# Patient Record
Sex: Female | Born: 1965 | Hispanic: No | Smoking: Never smoker
Health system: Southern US, Community
[De-identification: ages and names within clinical notes are randomized; demographics above are authoritative.]

## PROBLEM LIST (undated history)

## (undated) DIAGNOSIS — F419 Anxiety disorder, unspecified: Secondary | ICD-10-CM

## (undated) HISTORY — PX: UTERINE FIBROID SURGERY: SHX826

## (undated) HISTORY — PX: ABDOMINAL HYSTERECTOMY: SHX81

---

## 2014-07-12 ENCOUNTER — Other Ambulatory Visit: Payer: Self-pay | Admitting: Family Medicine

## 2014-07-12 DIAGNOSIS — Z1231 Encounter for screening mammogram for malignant neoplasm of breast: Secondary | ICD-10-CM

## 2014-07-20 ENCOUNTER — Ambulatory Visit
Admission: RE | Admit: 2014-07-20 | Discharge: 2014-07-20 | Disposition: A | Discharge status: Home / Self Care | Payer: BC Managed Care – PPO | Source: Ambulatory Visit | Attending: Family Medicine | Admitting: Family Medicine

## 2014-07-20 DIAGNOSIS — Z1231 Encounter for screening mammogram for malignant neoplasm of breast: Secondary | ICD-10-CM

## 2014-10-17 ENCOUNTER — Encounter (HOSPITAL_BASED_OUTPATIENT_CLINIC_OR_DEPARTMENT_OTHER): Payer: Self-pay | Admitting: *Deleted

## 2014-10-17 ENCOUNTER — Emergency Department (HOSPITAL_BASED_OUTPATIENT_CLINIC_OR_DEPARTMENT_OTHER): Payer: BC Managed Care – PPO

## 2014-10-17 ENCOUNTER — Emergency Department (HOSPITAL_BASED_OUTPATIENT_CLINIC_OR_DEPARTMENT_OTHER)
Admission: EM | Admit: 2014-10-17 | Discharge: 2014-10-17 | Disposition: A | Discharge status: Home / Self Care | Payer: BC Managed Care – PPO | Attending: Emergency Medicine | Admitting: Emergency Medicine

## 2014-10-17 DIAGNOSIS — Z3202 Encounter for pregnancy test, result negative: Secondary | ICD-10-CM | POA: Insufficient documentation

## 2014-10-17 DIAGNOSIS — R109 Unspecified abdominal pain: Secondary | ICD-10-CM

## 2014-10-17 DIAGNOSIS — N12 Tubulo-interstitial nephritis, not specified as acute or chronic: Secondary | ICD-10-CM | POA: Insufficient documentation

## 2014-10-17 LAB — COMPREHENSIVE METABOLIC PANEL
ALBUMIN: 4.4 g/dL (ref 3.5–5.2)
ALT: 38 U/L — ABNORMAL HIGH (ref 0–35)
AST: 37 U/L (ref 0–37)
Alkaline Phosphatase: 55 U/L (ref 39–117)
Anion gap: 9 (ref 5–15)
BUN: 17 mg/dL (ref 6–23)
CALCIUM: 9.2 mg/dL (ref 8.4–10.5)
CO2: 26 mmol/L (ref 19–32)
Chloride: 101 mmol/L (ref 96–112)
Creatinine, Ser: 0.84 mg/dL (ref 0.50–1.10)
GFR, EST NON AFRICAN AMERICAN: 80 mL/min — AB (ref >90)
Glucose, Bld: 84 mg/dL (ref 70–99)
POTASSIUM: 3.8 mmol/L (ref 3.5–5.1)
Sodium: 136 mmol/L (ref 135–145)
TOTAL PROTEIN: 7.7 g/dL (ref 6.0–8.3)
Total Bilirubin: 0.4 mg/dL (ref 0.3–1.2)

## 2014-10-17 LAB — CBC WITH DIFFERENTIAL/PLATELET
Basophils Absolute: 0 10*3/uL (ref 0.0–0.1)
Basophils Relative: 0 % (ref 0–1)
Eosinophils Absolute: 0.1 10*3/uL (ref 0.0–0.7)
Eosinophils Relative: 1 % (ref 0–5)
HEMATOCRIT: 38.9 % (ref 36.0–46.0)
Hemoglobin: 12.3 g/dL (ref 12.0–15.0)
Lymphocytes Relative: 31 % (ref 12–46)
Lymphs Abs: 3.1 10*3/uL (ref 0.7–4.0)
MCH: 26.7 pg (ref 26.0–34.0)
MCHC: 31.6 g/dL (ref 30.0–36.0)
MCV: 84.4 fL (ref 78.0–100.0)
MONOS PCT: 9 % (ref 3–12)
Monocytes Absolute: 0.9 10*3/uL (ref 0.1–1.0)
NEUTROS PCT: 59 % (ref 43–77)
Neutro Abs: 5.9 10*3/uL (ref 1.7–7.7)
Platelets: 259 10*3/uL (ref 150–400)
RBC: 4.61 MIL/uL (ref 3.87–5.11)
RDW: 13.4 % (ref 11.5–15.5)
WBC: 10 10*3/uL (ref 4.0–10.5)

## 2014-10-17 LAB — URINE MICROSCOPIC-ADD ON

## 2014-10-17 LAB — URINALYSIS, ROUTINE W REFLEX MICROSCOPIC
Bilirubin Urine: NEGATIVE
Glucose, UA: NEGATIVE mg/dL
KETONES UR: NEGATIVE mg/dL
NITRITE: NEGATIVE
PH: 5.5 (ref 5.0–8.0)
Protein, ur: 100 mg/dL — AB
Specific Gravity, Urine: 1.012 (ref 1.005–1.030)
UROBILINOGEN UA: 0.2 mg/dL (ref 0.0–1.0)

## 2014-10-17 LAB — PREGNANCY, URINE: Preg Test, Ur: NEGATIVE

## 2014-10-17 MED ORDER — CEFTRIAXONE SODIUM 1 G IJ SOLR
1.0000 g | Freq: Once | INTRAMUSCULAR | Status: AC
  Administered 2014-10-17: 1 g via INTRAVENOUS

## 2014-10-17 MED ORDER — KETOROLAC TROMETHAMINE 30 MG/ML IJ SOLN
30.0000 mg | Freq: Once | INTRAMUSCULAR | Status: AC
  Administered 2014-10-17: 30 mg via INTRAVENOUS
  Filled 2014-10-17: qty 1

## 2014-10-17 MED ORDER — MORPHINE SULFATE 4 MG/ML IJ SOLN
4.0000 mg | Freq: Once | INTRAMUSCULAR | Status: AC
  Administered 2014-10-17: 4 mg via INTRAVENOUS
  Filled 2014-10-17: qty 1

## 2014-10-17 MED ORDER — CEFTRIAXONE SODIUM 1 G IJ SOLR
INTRAMUSCULAR | Status: AC
  Filled 2014-10-17: qty 10

## 2014-10-17 MED ORDER — CIPROFLOXACIN HCL 500 MG PO TABS: 500.0000 mg | ORAL_TABLET | Freq: Two times a day (BID) | ORAL | Status: AC

## 2014-10-17 MED ORDER — HYDROCODONE-ACETAMINOPHEN 5-325 MG PO TABS: 1.0000 | ORAL_TABLET | Freq: Four times a day (QID) | ORAL | Status: AC | PRN

## 2014-10-17 MED ORDER — SODIUM CHLORIDE 0.9 % IV BOLUS (SEPSIS)
1000.0000 mL | Freq: Once | INTRAVENOUS | Status: AC
  Administered 2014-10-17: 1000 mL via INTRAVENOUS

## 2014-10-17 NOTE — ED Notes (Signed)
Right flank pain x 1 day- similar episodes twice previously

## 2014-10-17 NOTE — Discharge Instructions (Signed)
Take motrin for pain.   Take cipro twice daily for 10 days.   Take vicodin for severe pain. DO NOT drive with it.   Follow up with your doctor.   Return to ER if you have severe pain, fever, vomiting.

## 2014-10-17 NOTE — ED Provider Notes (Signed)
CSN: [id number]     Arrival date & time 10/17/14  1449 History  This chart was scribed for Richardean Canal, MD by Tonye Royalty, ED Scribe. This patient was seen in room MH04/MH04 and the patient's care was started at 3:22 PM.    Chief Complaint  Patient presents with  . Flank Pain   The history is provided by the patient. No language interpreter was used.    HPI Comments: Rebecca Peterson is a 49 y.o. female who presents to the Emergency Department complaining of right flank pain with onset yesterday. She reports having similar symptoms twice previously. She states it resolved temporarily yesterday with Ibuprofen; she has not used any other medication for it. She describes it as cramping. She states it moved around the side but then returned to her right flank and is staying there. She denies heavy lifting or other exertion. She reports history of scoliosis, but states flare ups have not affected her flank area before. She denies history of kidney stones or other health problems. She has had children and has difficulty comparing pain it to labor pain because it was a long time ago. She denies vomiting, fever, dysuria, hematuria, constipation, or diarrhea.  History reviewed. No pertinent past medical history. Past Surgical History  Procedure Laterality Date  . Cesarean section    . Uterine fibroid surgery     No family history on file. History  Substance Use Topics  . Smoking status: Never Smoker   . Smokeless tobacco: Never Used  . Alcohol Use: Yes     Comment: social drinker   OB History    No data available     Review of Systems  Constitutional: Negative for fever.  Gastrointestinal: Negative for vomiting, diarrhea and constipation.  Genitourinary: Positive for flank pain. Negative for dysuria and hematuria.  All other systems reviewed and are negative.     Allergies  Review of patient's allergies indicates no known allergies.  Home Medications   Prior to Admission medications    Not on File   BP 112/69 mmHg  Pulse 65  Temp(Src) 97.9 F (36.6 C) (Oral)  Resp 18  Ht  (1.727 m)  Wt 163 lb (73.936 kg)  BMI 24.79 kg/m2  SpO2 100% Physical Exam  Constitutional: She is oriented to person, place, and time. She appears well-developed and well-nourished.  HENT:  Head: Normocephalic and atraumatic.  Eyes: Conjunctivae are normal.  Neck: Normal range of motion. Neck supple.  Cardiovascular: Normal rate, regular rhythm and normal heart sounds.   No murmur heard. Pulmonary/Chest: Effort normal and breath sounds normal. No respiratory distress. She has no wheezes. She has no rales.  Abdominal: Soft. She exhibits no distension. There is no tenderness.  Right CVA tenderness  Musculoskeletal: Normal range of motion.  Neurological: She is alert and oriented to person, place, and time.  Skin: Skin is warm and dry.  Psychiatric: She has a normal mood and affect.  Nursing note and vitals reviewed.   ED Course  Procedures (including critical care time)  DIAGNOSTIC STUDIES: Oxygen Saturation is 100% on room air, normal by my interpretation.    COORDINATION OF CARE: 3:30 PM Discussed treatment plan with patient at beside, including CT scan and lab work. The patient agrees with the plan and has no further questions at this time.   Labs Review Labs Reviewed  URINALYSIS, ROUTINE W REFLEX MICROSCOPIC - Abnormal; Notable for the following:    APPearance CLOUDY (*)    Hgb urine  dipstick MODERATE (*)    Protein, ur 100 (*)    Leukocytes, UA LARGE (*)    All other components within normal limits  COMPREHENSIVE METABOLIC PANEL - Abnormal; Notable for the following:    ALT 38 (*)    GFR calc non Af Amer 80 (*)    All other components within normal limits  URINE MICROSCOPIC-ADD ON - Abnormal; Notable for the following:    Bacteria, UA MANY (*)    All other components within normal limits  PREGNANCY, URINE  CBC WITH DIFFERENTIAL/PLATELET    Imaging Review Ct  Renal Stone Study  10/17/2014   CLINICAL DATA:  RIGHT flank pain. Onset of symptoms yesterday. Urinary tract infection.  EXAM: CT ABDOMEN AND PELVIS WITHOUT CONTRAST  TECHNIQUE: Multidetector CT imaging of the abdomen and pelvis was performed following the standard protocol without IV contrast.  COMPARISON:  None.  FINDINGS: Musculoskeletal:  No aggressive osseous lesions.  Lung Bases: Mild motion artifact. Dependent atelectasis. Old granulomatous disease with RIGHT lower lobe calcified granuloma.  Liver: Unenhanced CT was performed per clinician order. Lack of IV contrast limits sensitivity and specificity, especially for evaluation of abdominal/pelvic solid viscera. Grossly normal.  Spleen:  Normal.  Gallbladder:  Contracted.  No calcified stones.  Common bile duct:  Normal.  Axial normal.  Pancreas:  Normal.  Adrenal glands:  Normal.  Kidneys: LEFT kidney and ureter appear normal. There is mild RIGHT hydronephrosis with stranding of the RIGHT renal pelvis and periureteric stranding extending to the anatomic pelvis. No renal or ureteral calculi. The appearance is most consistent with ascending urinary tract infection.  Stomach:  Grossly normal.  Small bowel:  Normal.  Colon:   Normal appendix.  Large stool burden in the RIGHT colon.  Pelvic Genitourinary: Urinary bladder shows stranding at the anterior bladder dome, again consistent with urinary tract infection. Moderate distention of the urinary bladder. Probable partial or complete hysterectomy.  Peritoneum: Physiologic free fluid.  No free air.  Vasculature: Grossly normal.  Body Wall: Low anterior abdominal wall scarring. Fat containing periumbilical hernia.  IMPRESSION: Constellation of findings compatible with ascending urinary tract infection on the RIGHT side and cystitis.   Electronically Signed   By: Andreas NewportGeoffrey  Lamke M.D.   On: 10/17/2014 17:38     EKG Interpretation None      MDM   Final diagnoses:  Right flank pain   Rebecca CordsMirna Reuel Peterson is a 49  y.o. female here with R flank pain. Consider MSK vs stone. Will get labs, UA, CT renal stone.   6:32 PM CT showed R cystitis and likely pyelo. UA + UTI. Given ceftriaxone. WBC nl. Doesn't appear septic. Will dc home on abx. Urine culture sent.    I personally performed the services described in this documentation, which was scribed in my presence. The recorded information has been reviewed and is accurate.   Richardean Canalavid H Carisha Kantor, MD 10/17/14 [phone]

## 2014-10-19 LAB — URINE CULTURE: Colony Count: >=100000

## 2014-10-20 ENCOUNTER — Telehealth (HOSPITAL_BASED_OUTPATIENT_CLINIC_OR_DEPARTMENT_OTHER): Payer: Self-pay | Admitting: Emergency Medicine

## 2014-10-20 NOTE — Telephone Encounter (Signed)
Post ED Visit - Positive Culture Follow-up  Culture report reviewed by antimicrobial stewardship pharmacist: []  Wes Dulaney, Pharm.D., BCPS [x]  Celedonio MiyamotoJeremy Frens, [address] BCPS []  Georgina PillionElizabeth Martin, Pharm.D., BCPS []  [place]ason CityMinh Pham, [address] BCPS, AAHIVP []  Estella HuskMichelle Turner, Pharm.D., BCPS, AAHIVP []  Elder CyphersLorie Poole, [address] BCPS  Positive urine culture E. coli Treated with ciprofloxacin, organism sensitive to the same and no further patient follow-up is required at this time.  Berle MullMiller, Rigdon Macomber 10/20/2014, 2:15 PM

## 2015-10-21 ENCOUNTER — Emergency Department (HOSPITAL_COMMUNITY)
Admission: EM | Admit: 2015-10-21 | Discharge: 2015-10-21 | Disposition: A | Discharge status: Home / Self Care | Payer: BC Managed Care – PPO | Attending: Emergency Medicine | Admitting: Emergency Medicine

## 2015-10-21 ENCOUNTER — Encounter (HOSPITAL_COMMUNITY): Payer: Self-pay | Admitting: Emergency Medicine

## 2015-10-21 DIAGNOSIS — Y9389 Activity, other specified: Secondary | ICD-10-CM | POA: Insufficient documentation

## 2015-10-21 DIAGNOSIS — S0181XA Laceration without foreign body of other part of head, initial encounter: Secondary | ICD-10-CM | POA: Insufficient documentation

## 2015-10-21 DIAGNOSIS — Z8659 Personal history of other mental and behavioral disorders: Secondary | ICD-10-CM | POA: Diagnosis not present

## 2015-10-21 DIAGNOSIS — F419 Anxiety disorder, unspecified: Secondary | ICD-10-CM | POA: Diagnosis not present

## 2015-10-21 DIAGNOSIS — Z23 Encounter for immunization: Secondary | ICD-10-CM | POA: Insufficient documentation

## 2015-10-21 DIAGNOSIS — Y998 Other external cause status: Secondary | ICD-10-CM | POA: Insufficient documentation

## 2015-10-21 DIAGNOSIS — Y92511 Restaurant or cafe as the place of occurrence of the external cause: Secondary | ICD-10-CM | POA: Diagnosis not present

## 2015-10-21 DIAGNOSIS — W01198A Fall on same level from slipping, tripping and stumbling with subsequent striking against other object, initial encounter: Secondary | ICD-10-CM | POA: Insufficient documentation

## 2015-10-21 HISTORY — DX: Anxiety disorder, unspecified: F41.9

## 2015-10-21 MED ORDER — TETANUS-DIPHTH-ACELL PERTUSSIS 5-2.5-18.5 LF-MCG/0.5 IM SUSP
0.5000 mL | Freq: Once | INTRAMUSCULAR | Status: AC
  Administered 2015-10-21: 0.5 mL via INTRAMUSCULAR
  Filled 2015-10-21: qty 0.5

## 2015-10-21 MED ORDER — LIDOCAINE-EPINEPHRINE-TETRACAINE (LET) SOLUTION
3.0000 mL | Freq: Once | NASAL | Status: AC
  Administered 2015-10-21: 3 mL via TOPICAL
  Filled 2015-10-21: qty 3

## 2015-10-21 MED ORDER — LORAZEPAM 1 MG PO TABS
1.0000 mg | ORAL_TABLET | Freq: Once | ORAL | Status: AC
  Administered 2015-10-21: 1 mg via ORAL
  Filled 2015-10-21: qty 1

## 2015-10-21 MED ORDER — LIDOCAINE-EPINEPHRINE (PF) 2 %-1:200000 IJ SOLN
10.0000 mL | Freq: Once | INTRAMUSCULAR | Status: AC
  Administered 2015-10-21: 10 mL
  Filled 2015-10-21: qty 10

## 2015-10-21 NOTE — ED Notes (Signed)
Pt states she slipped on wet floor at restaurant @ 1900, struck chin on sink, laceration noted, minimal bleeding

## 2015-10-21 NOTE — Discharge Instructions (Signed)

## 2015-10-21 NOTE — ED Provider Notes (Signed)
CSN: [id number]     Arrival date & time 10/21/15  2054 History  By signing my name below, I, Rebecca Peterson, attest that this documentation has been prepared under the direction and in the presence of TXU CorpHannah Matia Zelada, PA-C.   Electronically Signed: Iona Beardhristian Peterson, ED Scribe 10/21/2015 at 10:51 PM.  Chief Complaint  Patient presents with  . Laceration    The history is provided by the patient and medical records. No language interpreter was used.   HPI Comments: Leafy RoMirna E Peterson is a 50 y.o. female who presents to the Emergency Department complaining of sudden onset, laceration to central chin, onset around 7:30 PM when she fell and struck her chin on a sink. She denies LOC or significant head trauma in the incident. She reports minimal pain to the chin and initially dental pain to her lower teeth (now resolved). No other associated symptoms noted. No other worsening or alleviating factors noted. Pt denies neck pain, back pain, headache, numbness, weakness, syncope, loss of bowel or bladder control, seizure or any other pertinent symptoms. Pt is unsure of her tetanus status.  Pt reports she is very nervous and appears very anxious.    Past Medical History  Diagnosis Date  . Anxiety    Past Surgical History  Procedure Laterality Date  . Cesarean section     No family history on file. Social History  Substance Use Topics  . Smoking status: Never Smoker   . Smokeless tobacco: None  . Alcohol Use: Yes   OB History    No data available     Review of Systems  Constitutional: Negative for fever.  HENT: Negative for facial swelling and nosebleeds.   Musculoskeletal: Positive for arthralgias. Negative for back pain and neck pain.       Pain noted to chin.   Skin: Positive for wound.  Neurological: Negative for seizures, syncope, weakness, numbness and headaches.  Psychiatric/Behavioral: The patient is nervous/anxious.   All other systems reviewed and are negative.   Allergies   Review of patient's allergies indicates no known allergies.  Home Medications   Prior to Admission medications   Not on File   BP 136/80 mmHg  Pulse 98  Temp(Src) 98.1 F (36.7 C) (Oral)  Resp 18  Ht 5\' 8"  (1.727 m)  Wt 184 lb (83.462 kg)  BMI 27.98 kg/m2  SpO2 98% Physical Exam  Constitutional: She is oriented to person, place, and time. She appears well-developed and well-nourished. No distress.  HENT:  Head: Normocephalic and atraumatic.  No broken or cracked teeth; no loose teeth; no intraoral lacerations; no trismus.   Eyes: Conjunctivae are normal. No scleral icterus.  Neck: Normal range of motion.  Full ROM without pain; no midline or paraspinal TTP.   Cardiovascular: Normal rate, regular rhythm, normal heart sounds and intact distal pulses.   No murmur heard. Capillary refill < 3 sec  Pulmonary/Chest: Effort normal and breath sounds normal. No respiratory distress.  Musculoskeletal: Normal range of motion. She exhibits no edema.  Neurological: She is alert and oriented to person, place, and time.  Skin: Skin is warm and dry. She is not diaphoretic.  3 cm laceration to underside of chin, not a through and through.   Psychiatric: Her mood appears anxious.  Pt very anxious about laceration repair  Nursing note and vitals reviewed.   ED Course  .Marland Kitchen.Laceration Repair Date/Time: 10/21/2015 10:27 PM Performed by: Dierdre ForthMUTHERSBAUGH, Heran Campau Authorized by: Dierdre ForthMUTHERSBAUGH, Maybelline Kolarik Consent: Verbal consent obtained. Risks and benefits: risks, benefits  and alternatives were discussed Consent given by: patient Patient understanding: patient states understanding of the procedure being performed Patient consent: the patient's understanding of the procedure matches consent given Procedure consent: procedure consent matches procedure scheduled Relevant documents: relevant documents present and verified Site marked: the operative site was marked Required items: required blood products,  implants, devices, and special equipment available Patient identity confirmed: verbally with patient and arm band Time out: Immediately prior to procedure a "time out" was called to verify the correct patient, procedure, equipment, support staff and site/side marked as required. Body area: head/neck Location details: chin Laceration length: 3 cm Foreign bodies: no foreign bodies Tendon involvement: none Nerve involvement: none Vascular damage: no Anesthesia: local infiltration Local anesthetic: lidocaine 2% with epinephrine and LET (lido,epi,tetracaine) Anesthetic total: 2 ml Patient sedated: no Preparation: Patient was prepped and draped in the usual sterile fashion. Irrigation solution: saline Irrigation method: syringe Amount of cleaning: extensive Skin closure: 6-0 Prolene Number of sutures: 6 Technique: running Approximation: close Approximation difficulty: complex Dressing: 4x4 sterile gauze Patient tolerance: Patient tolerated the procedure well with no immediate complications   (including critical care time) DIAGNOSTIC STUDIES: Oxygen Saturation is 98% on RA, normal by my interpretation.    COORDINATION OF CARE: 9:42 PM-Discussed treatment plan which includes laceration repair with pt at bedside and pt agreed to plan.     MDM   Final diagnoses:  Laceration of chin, initial encounter   Rebecca Peterson presents for laceration to the chin after fall.  Pt without neck pain.  FROM without pain and no TTP of the c-spine.  Doubt fracture.  No evidence of mandible fracture on clinical exam.  Normal dentition.  Pressure irrigation performed. Wound explored and base of wound visualized in a bloodless field without evidence of foreign body.  Laceration occurred < 8 hours prior to repair which was well tolerated.  Tdap updated.  Pt has no comorbidities to effect normal wound healing. Pt discharged without antibiotics.  Discussed suture home care with patient and answered questions.  Pt to follow-up for wound check and suture removal in 5 days; they are to return to the ED sooner for signs of infection. Pt is hemodynamically stable with no complaints prior to dc.   I personally performed the services described in this documentation, which was scribed in my presence. The recorded information has been reviewed and is accurate.   Dahlia Client Raymonda Pell, PA-C 10/21/15 2251  Leta Baptist, MD 10/23/15 [phone]

## 2015-11-01 ENCOUNTER — Encounter: Payer: Self-pay | Admitting: Dietician

## 2015-11-01 ENCOUNTER — Encounter: Payer: BC Managed Care – PPO | Attending: Physician Assistant | Admitting: Dietician

## 2015-11-01 VITALS — Ht 68.0 in | Wt 196.0 lb

## 2015-11-01 DIAGNOSIS — R635 Abnormal weight gain: Secondary | ICD-10-CM | POA: Diagnosis not present

## 2015-11-01 NOTE — Progress Notes (Signed)
  Medical Nutrition Therapy:  Appt start time: 0805 end time:  0850.   Assessment:  Primary concerns today: Rebecca Peterson is here today since she has gained about 40 lbs in the past 2 years. Moved from [place] in that time period and feeling more depressed. Is getting help with her doctor for depression. Feels like she was eating differently in [place] and was not drinking alcohol as often. Drinking some every day since living here. Sometimes 4-6 shots everyday not mixed with anything. Wants to "forget that she is here". Moved here to get married and husband prepares her drinks each night since he has a beer. Did not drink much before and does not care about drinking much now (is ok cutting it out). Eating fried, spicy foods here which she does not like. Will sometimes eat leftovers for lunch even though she doesn't like them. When she was in [place] was able to make foods she liked and was having smaller portions.   Is working as a Runner, broadcasting/film/videoteacher. Lives with husband, adult son, and step son (every other week). She and her husband do the meal preparation. Misses quite a few meals each week. Eats out about 2 x week. Sometimes doesn't drink anything all day long.   Was walking everyday when she lived in [place] and not walking much here. Was taking Phentermine to help with weight loss and was feeling good when she was in [place]. Feeling hungrier now. Would like to be 153 lbs again.   Preferred Learning Style:   No preference indicated   Learning Readiness:   Ready  MEDICATIONS: see list   DIETARY INTAKE:  Usual eating pattern includes 2-3 meals and 1-2 snacks per day.  Avoided foods include: leftovers, pizza, spicy food    24-hr recall:  B ( AM): none or cereal (Special K with nuts and 2% milk) Snk ( AM): grapes or granola bar  L ( PM): leftovers from dinner (doesn't like this), rice, meats, beans or salad or pasta Snk ( PM): none D ( PM):  rice, meats, beans or salad or pasta Snk ( PM): none or 1 lb of grapes  or ice cream or chips Beverages: water, coffee rarely, bourbon/tequilla 4-6 shots per day, wine 2 x week 1-2 glasses    Usual physical activity: 1 x week for 60 minutes  Estimated energy needs: 1800 calories 200 g carbohydrates 135 g protein 50 g fat  Progress Towards Goal(s):  In progress.   Nutritional Diagnosis:  Esko-3.4 Unintentional weight gain As related to increased portion sizes, fried foods, and daily alcohol intake.  As evidenced by 40 lb weight gain in past 2 years.    Intervention:  Nutrition counseling provided. Plan: Talk to your husband about having you prepare all the dinner meals.  Avoid alcohol in order to lose weight.  Talk to your husband about how you prefer to exercise by yourself (get a better workout). You get a better workout without talking.  Make your own plate.   Teaching Method Utilized:  Visual Auditory Hands on  Handouts given during visit include:  none  Barriers to learning/adherence to lifestyle change: depression  Demonstrated degree of understanding via:  Teach Back   Monitoring/Evaluation:  Dietary intake, exercise, and body weight in 3 week(s).

## 2015-11-01 NOTE — Patient Instructions (Signed)
Talk to your husband about having you prepare all the dinner meals.  Avoid alcohol in order to lose weight.  Talk to your husband about how you prefer to exercise by yourself (get a better workout). You get a better workout without talking.  Make your own plate.

## 2015-11-22 ENCOUNTER — Ambulatory Visit: Payer: BC Managed Care – PPO | Admitting: Dietician

## 2015-11-24 ENCOUNTER — Encounter: Payer: BC Managed Care – PPO | Attending: Physician Assistant | Admitting: Dietician

## 2015-11-24 ENCOUNTER — Encounter: Payer: Self-pay | Admitting: Dietician

## 2015-11-24 VITALS — Ht 68.0 in | Wt 193.0 lb

## 2015-11-24 DIAGNOSIS — R635 Abnormal weight gain: Secondary | ICD-10-CM | POA: Diagnosis present

## 2015-11-24 NOTE — Patient Instructions (Addendum)
Try having a boiled egg with a smaller bowl of cereal in the morning. Fill half of your plate with vegetables at lunch and dinner, quarter of the plate with protein, quarter of the plate with starch/fruit. Continue walking for 60 minutes each day. Try relaxation exercises before bed.

## 2015-11-24 NOTE — Progress Notes (Signed)
  Medical Nutrition Therapy:  Appt start time: 0520 end time: 550 .   Assessment:  Primary concerns today: Rebecca Peterson is here today for a follow up for weight gain. Has lost 3 lbs in the past 3 weeks. Has been walking by herself everyday for 1 hour without her husband. Has been drinking more water. Eating lighter foods at dinner. Eating smaller portions of the food that his husband cooks and is eating her own food. Drinking a lot less alcohol than before.   Has trouble sleeping without sleeping pills. Has trouble waking up if does taking the pill. If she doesn't take the pill she will stay up and eat.   Preferred Learning Style:   No preference indicated   Learning Readiness:   Ready  MEDICATIONS: see list   DIETARY INTAKE:  Usual eating pattern includes 2-3 meals and 1-2 snacks per day.  Avoided foods include: leftovers, pizza, spicy food    24-hr recall:  B ( AM): Special K with granola and 2% milk) Snk ( AM): fruit L ( PM): leftovers from dinner or salad from school  Snk ( PM): none D ( PM):  Brown rice with fish, sweet potato with meat and small amount of food husband cooks - pasta Snk ( PM): none or 1 lb of grapes or ice cream or chips Beverages: water, coffee rarely, wine 2 x week 1-2 glasses    Usual physical activity: walking everyday for 60 minutes  Estimated energy needs: 1800 calories 200 g carbohydrates 135 g protein 50 g fat  Progress Towards Goal(s):  In progress.   Nutritional Diagnosis:  -3.4 Unintentional weight gain As related to increased portion sizes, fried foods, and daily alcohol intake.  As evidenced by 40 lb weight gain in past 2 years.    Intervention:  Nutrition counseling provided. Plan: Try having a boiled egg with a smaller bowl of cereal in the morning. Fill half of your plate with vegetables at lunch and dinner, quarter of the plate with protein, quarter of the plate with starch/fruit. Continue walking for 60 minutes each day. Try  relaxation exercises before bed.  Teaching Method Utilized:  Visual Auditory Hands on  Handouts given during visit include:  Relaxation tips  Barriers to learning/adherence to lifestyle change: depression  Demonstrated degree of understanding via:  Teach Back   Monitoring/Evaluation:  Dietary intake, exercise, and body weight in 6 week(s).

## 2016-01-05 ENCOUNTER — Ambulatory Visit: Payer: BC Managed Care – PPO | Admitting: Dietician

## 2016-01-25 IMAGING — CT CT RENAL STONE PROTOCOL
2 of 3 series · 13 of 36 positions shown, 18 images · non-contrast
Comparison: None.

CLINICAL DATA: RIGHT flank pain. Onset of symptoms yesterday.
Urinary tract infection.

EXAM:
CT ABDOMEN AND PELVIS WITHOUT CONTRAST
TECHNIQUE: Multidetector CT imaging of the abdomen and pelvis was performed
following the standard protocol without IV contrast.

[Series 2: renal stone < 200 lbs 5.0 b31f · axial · 0.81mm/px · z∈[-472,-58]mm · 12 of 95 slices shown, 16 images]
[im 8/95  soft-tissue]
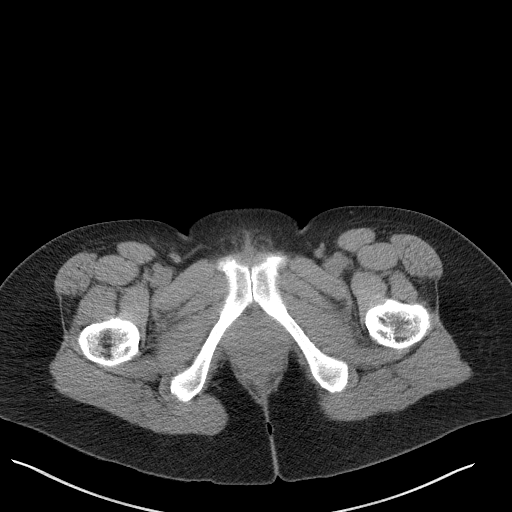
[im 8/95  bone]
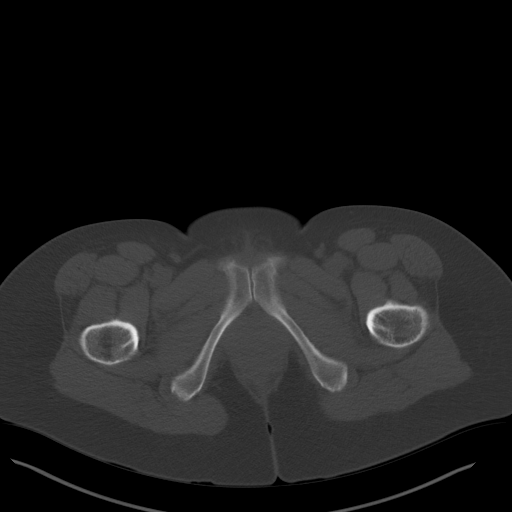
[im 16/95  soft-tissue]
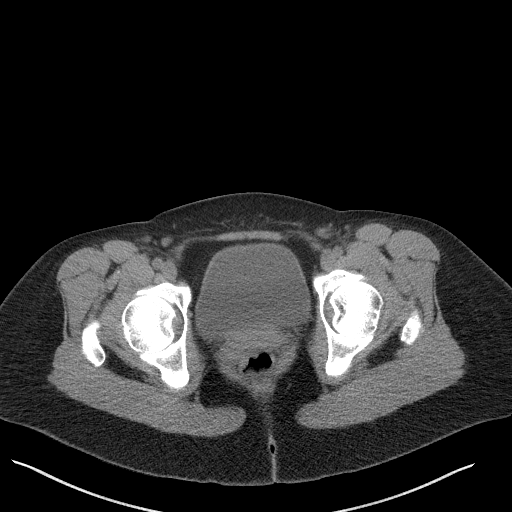
[im 27/95  soft-tissue]
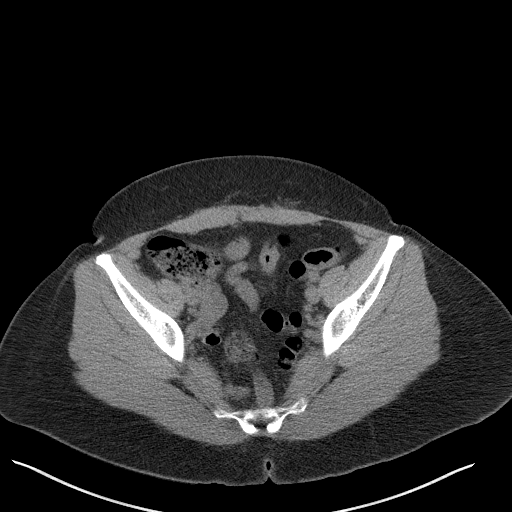
[im 34/95  soft-tissue]
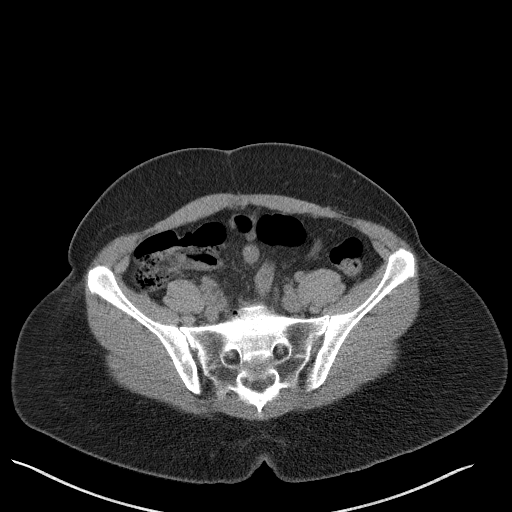
[im 42/95  soft-tissue]
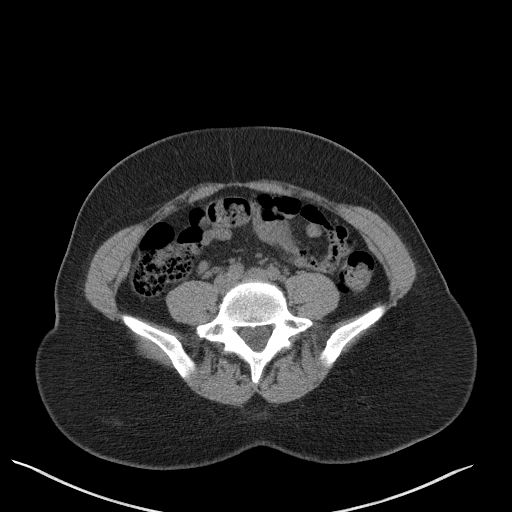
[im 53/95  soft-tissue]
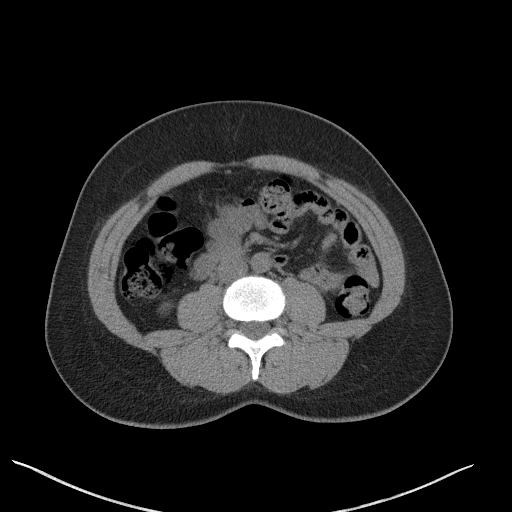
[im 61/95  soft-tissue]
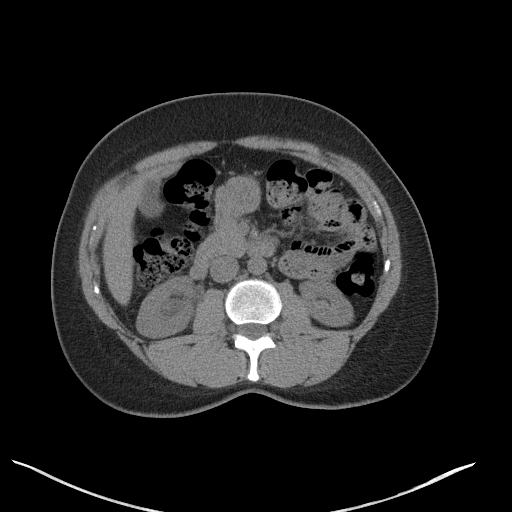
[im 72/95  soft-tissue]
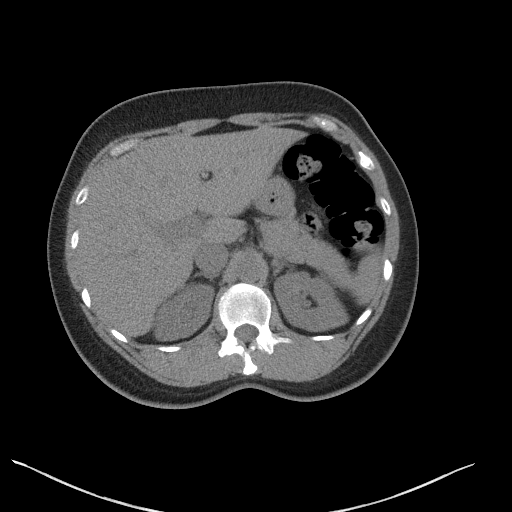
[im 79/95  soft-tissue]
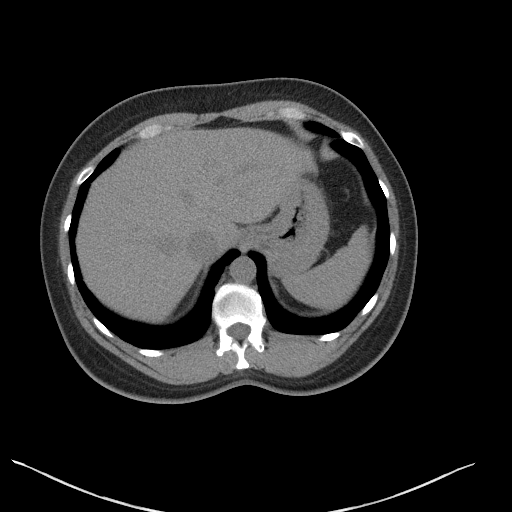
[im 79/95  lung]
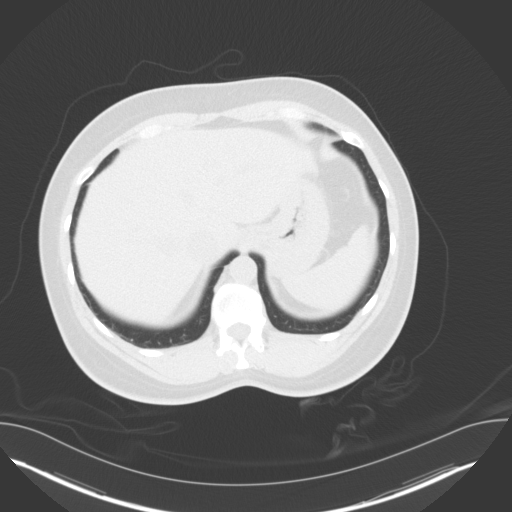
[im 79/95  bone]
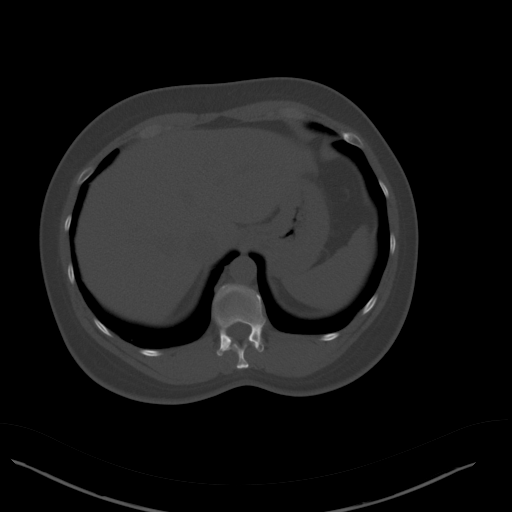
[im 83/95  lung]
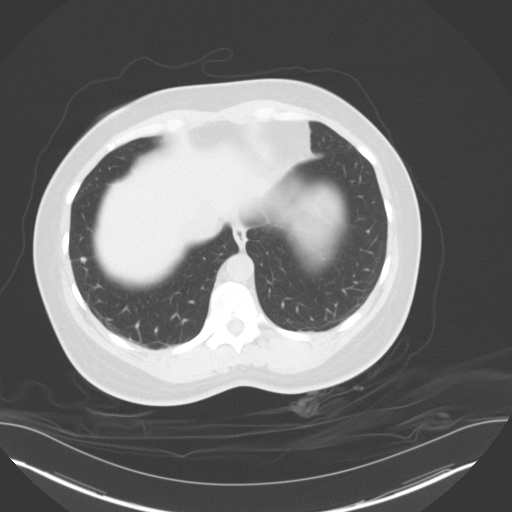
[im 87/95  soft-tissue]
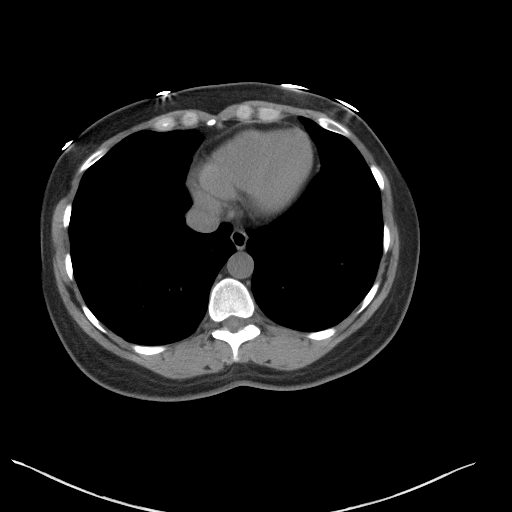
[im 87/95  lung]
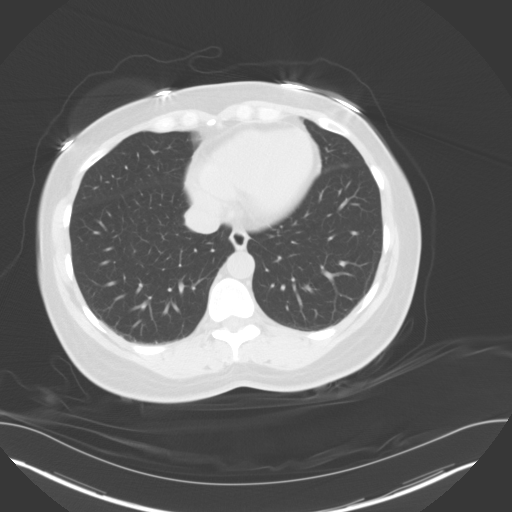
[im 91/95  lung]
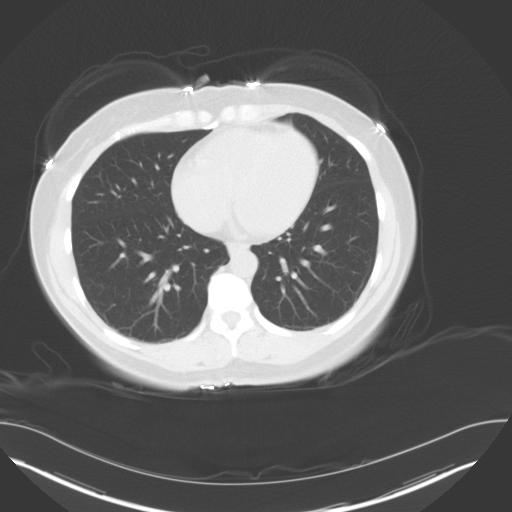

[Series 9: renal stone 3.0 sagittal · sagittal · 0.67mm/px · 1 of 99 slices shown, 2 images]
[im 33/99  soft-tissue]
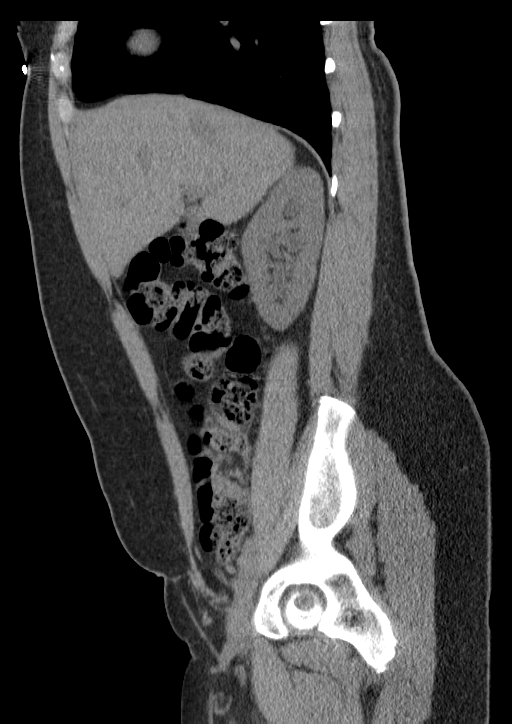
[im 33/99  bone]
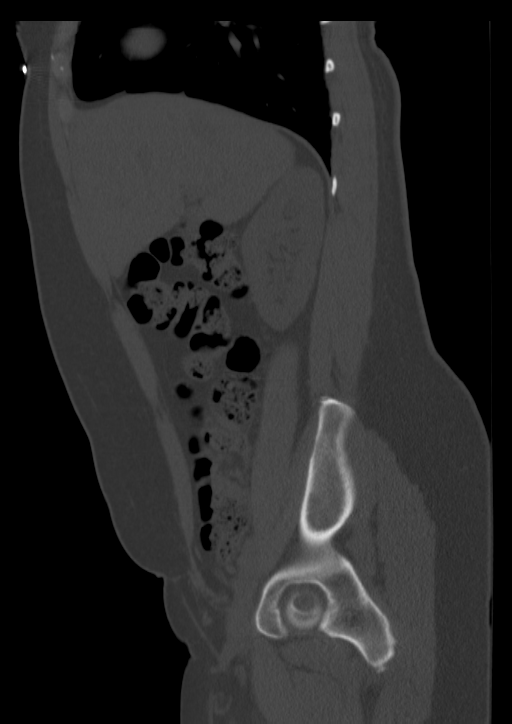

[13 of 36 positions shown; findings below may reference images not displayed]

FINDINGS: Musculoskeletal:  No aggressive osseous lesions.

Lung Bases: Mild motion artifact. Dependent atelectasis. Old
granulomatous disease with RIGHT lower lobe calcified granuloma.

Liver: Unenhanced CT was performed per clinician order. Lack of IV
contrast limits sensitivity and specificity, especially for
evaluation of abdominal/pelvic solid viscera. Grossly normal.

Spleen:  Normal.

Gallbladder:  Contracted.  No calcified stones.

Common bile duct:  Normal.  Axial normal.

Pancreas:  Normal.

Adrenal glands:  Normal.

Kidneys: LEFT kidney and ureter appear normal. There is mild RIGHT
hydronephrosis with stranding of the RIGHT renal pelvis and
periureteric stranding extending to the anatomic pelvis. No renal or
ureteral calculi. The appearance is most consistent with ascending
urinary tract infection.

Stomach:  Grossly normal.

Small bowel:  Normal.

Colon:   Normal appendix.  Large stool burden in the RIGHT colon.

Pelvic Genitourinary: Urinary bladder shows stranding at the
anterior bladder dome, again consistent with urinary tract
infection. Moderate distention of the urinary bladder. Probable
partial or complete hysterectomy.

Peritoneum: Physiologic free fluid.  No free air.

Vasculature: Grossly normal.

Body Wall: Low anterior abdominal wall scarring. Fat containing
periumbilical hernia.
IMPRESSION: Constellation of findings compatible with ascending urinary tract
infection on the RIGHT side and cystitis.

## 2016-07-19 ENCOUNTER — Encounter (HOSPITAL_BASED_OUTPATIENT_CLINIC_OR_DEPARTMENT_OTHER): Payer: Self-pay | Admitting: *Deleted

## 2017-09-16 ENCOUNTER — Other Ambulatory Visit: Payer: Self-pay | Admitting: Family Medicine

## 2017-09-16 DIAGNOSIS — Z1231 Encounter for screening mammogram for malignant neoplasm of breast: Secondary | ICD-10-CM

## 2017-10-04 ENCOUNTER — Ambulatory Visit: Payer: Self-pay

## 2017-10-25 ENCOUNTER — Ambulatory Visit: Payer: Self-pay

## 2017-10-29 ENCOUNTER — Ambulatory Visit
Admission: RE | Admit: 2017-10-29 | Discharge: 2017-10-29 | Disposition: A | Discharge status: Home / Self Care | Payer: BC Managed Care – PPO | Source: Ambulatory Visit | Attending: Family Medicine | Admitting: Family Medicine

## 2017-10-29 DIAGNOSIS — Z1231 Encounter for screening mammogram for malignant neoplasm of breast: Secondary | ICD-10-CM
